# Patient Record
Sex: Female | Born: 1980 | Race: Black or African American | Hispanic: No | Marital: Single | Smoking: Current every day smoker
Health system: Southern US, Community
[De-identification: ages and names within clinical notes are randomized; demographics above are authoritative.]

---
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
## 2002-01-04 ENCOUNTER — Encounter: Admission: RE | Admit: 2002-01-04 | Discharge: 2002-01-04 | Payer: Self-pay | Admitting: *Deleted

## 2002-01-18 ENCOUNTER — Encounter: Admission: RE | Admit: 2002-01-18 | Discharge: 2002-01-18 | Payer: Self-pay | Admitting: *Deleted

## 2002-01-24 ENCOUNTER — Inpatient Hospital Stay (HOSPITAL_COMMUNITY): Admission: RE | Admit: 2002-01-24 | Discharge: 2002-01-30 | Payer: Self-pay | Admitting: *Deleted

## 2002-01-30 ENCOUNTER — Encounter: Payer: Self-pay | Admitting: *Deleted

## 2002-02-07 ENCOUNTER — Observation Stay (HOSPITAL_COMMUNITY): Admission: AD | Admit: 2002-02-07 | Discharge: 2002-02-08 | Payer: Self-pay | Admitting: *Deleted

## 2002-02-07 ENCOUNTER — Encounter: Admission: RE | Admit: 2002-02-07 | Discharge: 2002-02-07 | Payer: Self-pay | Admitting: *Deleted

## 2002-02-14 ENCOUNTER — Inpatient Hospital Stay (HOSPITAL_COMMUNITY): Admission: RE | Admit: 2002-02-14 | Discharge: 2002-02-14 | Payer: Self-pay | Admitting: *Deleted

## 2002-02-14 ENCOUNTER — Encounter: Admission: RE | Admit: 2002-02-14 | Discharge: 2002-02-14 | Payer: Self-pay | Admitting: *Deleted

## 2002-02-21 ENCOUNTER — Encounter: Admission: RE | Admit: 2002-02-21 | Discharge: 2002-02-21 | Payer: Self-pay | Admitting: *Deleted

## 2002-02-21 ENCOUNTER — Inpatient Hospital Stay (HOSPITAL_COMMUNITY): Admission: AD | Admit: 2002-02-21 | Discharge: 2002-02-21 | Payer: Self-pay | Admitting: *Deleted

## 2002-02-27 ENCOUNTER — Encounter (HOSPITAL_COMMUNITY): Admission: RE | Admit: 2002-02-27 | Discharge: 2002-03-29 | Payer: Self-pay | Admitting: *Deleted

## 2002-02-28 ENCOUNTER — Inpatient Hospital Stay (HOSPITAL_COMMUNITY): Admission: AD | Admit: 2002-02-28 | Discharge: 2002-03-02 | Payer: Self-pay | Admitting: Obstetrics and Gynecology

## 2002-04-03 ENCOUNTER — Encounter: Payer: Self-pay | Admitting: *Deleted

## 2002-04-03 ENCOUNTER — Encounter (HOSPITAL_COMMUNITY): Admission: RE | Admit: 2002-04-03 | Discharge: 2002-04-26 | Payer: Self-pay | Admitting: *Deleted

## 2002-04-24 ENCOUNTER — Encounter: Payer: Self-pay | Admitting: *Deleted

## 2002-04-27 ENCOUNTER — Inpatient Hospital Stay (HOSPITAL_COMMUNITY): Admission: AD | Admit: 2002-04-27 | Discharge: 2002-04-29 | Payer: Self-pay | Admitting: *Deleted

## 2002-04-30 ENCOUNTER — Encounter: Admission: RE | Admit: 2002-04-30 | Discharge: 2002-05-30 | Payer: Self-pay | Admitting: *Deleted

## 2002-12-17 ENCOUNTER — Inpatient Hospital Stay (HOSPITAL_COMMUNITY): Admission: AD | Admit: 2002-12-17 | Discharge: 2002-12-17 | Payer: Self-pay | Admitting: Obstetrics and Gynecology

## 2003-10-28 ENCOUNTER — Ambulatory Visit (HOSPITAL_COMMUNITY): Admission: RE | Admit: 2003-10-28 | Discharge: 2003-10-28 | Payer: Self-pay | Admitting: *Deleted

## 2003-12-09 ENCOUNTER — Ambulatory Visit (HOSPITAL_COMMUNITY): Admission: RE | Admit: 2003-12-09 | Discharge: 2003-12-09 | Payer: Self-pay | Admitting: *Deleted

## 2004-01-17 ENCOUNTER — Encounter: Admission: RE | Admit: 2004-01-17 | Discharge: 2004-01-17 | Payer: Self-pay | Admitting: Family Medicine

## 2004-02-07 ENCOUNTER — Encounter: Admission: RE | Admit: 2004-02-07 | Discharge: 2004-02-07 | Payer: Self-pay | Admitting: Family Medicine

## 2004-05-08 ENCOUNTER — Inpatient Hospital Stay (HOSPITAL_COMMUNITY): Admission: AD | Admit: 2004-05-08 | Discharge: 2004-05-10 | Payer: Self-pay | Admitting: *Deleted

## 2004-05-08 ENCOUNTER — Ambulatory Visit: Payer: Self-pay | Admitting: Family Medicine

## 2004-08-28 ENCOUNTER — Ambulatory Visit: Payer: Self-pay | Admitting: Family Medicine

## 2004-12-10 ENCOUNTER — Other Ambulatory Visit: Admission: RE | Admit: 2004-12-10 | Discharge: 2004-12-10 | Payer: Self-pay | Admitting: Family Medicine

## 2004-12-10 ENCOUNTER — Ambulatory Visit: Payer: Self-pay | Admitting: Family Medicine

## 2011-04-11 ENCOUNTER — Ambulatory Visit (INDEPENDENT_AMBULATORY_CARE_PROVIDER_SITE_OTHER): Payer: Self-pay

## 2011-04-11 ENCOUNTER — Inpatient Hospital Stay (INDEPENDENT_AMBULATORY_CARE_PROVIDER_SITE_OTHER)
Admission: RE | Admit: 2011-04-11 | Discharge: 2011-04-11 | Disposition: A | Discharge status: Home / Self Care | Payer: Self-pay | Source: Ambulatory Visit | Attending: Family Medicine | Admitting: Family Medicine

## 2011-04-11 DIAGNOSIS — S93409A Sprain of unspecified ligament of unspecified ankle, initial encounter: Secondary | ICD-10-CM

## 2014-10-31 ENCOUNTER — Emergency Department (HOSPITAL_COMMUNITY)
Admission: EM | Admit: 2014-10-31 | Discharge: 2014-11-01 | Disposition: A | Discharge status: Home / Self Care | Payer: Self-pay | Attending: Emergency Medicine | Admitting: Emergency Medicine

## 2014-10-31 ENCOUNTER — Encounter (HOSPITAL_COMMUNITY): Payer: Self-pay | Admitting: Physical Medicine and Rehabilitation

## 2014-10-31 ENCOUNTER — Emergency Department (HOSPITAL_COMMUNITY): Payer: Self-pay

## 2014-10-31 DIAGNOSIS — N7093 Salpingitis and oophoritis, unspecified: Secondary | ICD-10-CM | POA: Insufficient documentation

## 2014-10-31 DIAGNOSIS — R000 Tachycardia, unspecified: Secondary | ICD-10-CM | POA: Insufficient documentation

## 2014-10-31 DIAGNOSIS — E669 Obesity, unspecified: Secondary | ICD-10-CM | POA: Insufficient documentation

## 2014-10-31 DIAGNOSIS — Z3202 Encounter for pregnancy test, result negative: Secondary | ICD-10-CM | POA: Insufficient documentation

## 2014-10-31 DIAGNOSIS — Z720 Tobacco use: Secondary | ICD-10-CM | POA: Insufficient documentation

## 2014-10-31 DIAGNOSIS — Z79899 Other long term (current) drug therapy: Secondary | ICD-10-CM | POA: Insufficient documentation

## 2014-10-31 DIAGNOSIS — A5900 Urogenital trichomoniasis, unspecified: Secondary | ICD-10-CM | POA: Insufficient documentation

## 2014-10-31 DIAGNOSIS — A599 Trichomoniasis, unspecified: Secondary | ICD-10-CM

## 2014-10-31 LAB — URINALYSIS, ROUTINE W REFLEX MICROSCOPIC
GLUCOSE, UA: NEGATIVE mg/dL
HGB URINE DIPSTICK: NEGATIVE
KETONES UR: 40 mg/dL — AB
Nitrite: NEGATIVE
Protein, ur: NEGATIVE mg/dL
Specific Gravity, Urine: 1.028 (ref 1.005–1.030)
Urobilinogen, UA: 0.2 mg/dL (ref 0.0–1.0)
pH: 6 (ref 5.0–8.0)

## 2014-10-31 LAB — COMPREHENSIVE METABOLIC PANEL
ALT: 13 U/L (ref 0–35)
ANION GAP: 12 (ref 5–15)
AST: 17 U/L (ref 0–37)
Albumin: 3.1 g/dL — ABNORMAL LOW (ref 3.5–5.2)
Alkaline Phosphatase: 89 U/L (ref 39–117)
BILIRUBIN TOTAL: 0.6 mg/dL (ref 0.3–1.2)
BUN: 6 mg/dL (ref 6–23)
CO2: 25 mmol/L (ref 19–32)
CREATININE: 0.79 mg/dL (ref 0.50–1.10)
Calcium: 9.2 mg/dL (ref 8.4–10.5)
Chloride: 99 mmol/L (ref 96–112)
GFR calc non Af Amer: >90 mL/min (ref >90)
Glucose, Bld: 95 mg/dL (ref 70–99)
Potassium: 3.7 mmol/L (ref 3.5–5.1)
Sodium: 136 mmol/L (ref 135–145)
Total Protein: 7.8 g/dL (ref 6.0–8.3)

## 2014-10-31 LAB — CBC WITH DIFFERENTIAL/PLATELET
Basophils Absolute: 0 10*3/uL (ref 0.0–0.1)
Basophils Relative: 0 % (ref 0–1)
EOS PCT: 1 % (ref 0–5)
Eosinophils Absolute: 0.2 10*3/uL (ref 0.0–0.7)
HEMATOCRIT: 31.5 % — AB (ref 36.0–46.0)
Hemoglobin: 9.9 g/dL — ABNORMAL LOW (ref 12.0–15.0)
LYMPHS ABS: 2.8 10*3/uL (ref 0.7–4.0)
Lymphocytes Relative: 16 % (ref 12–46)
MCH: 23.7 pg — AB (ref 26.0–34.0)
MCHC: 31.4 g/dL (ref 30.0–36.0)
MCV: 75.5 fL — AB (ref 78.0–100.0)
Monocytes Absolute: 1.3 10*3/uL — ABNORMAL HIGH (ref 0.1–1.0)
Monocytes Relative: 8 % (ref 3–12)
NEUTROS PCT: 75 % (ref 43–77)
Neutro Abs: 13.1 10*3/uL — ABNORMAL HIGH (ref 1.7–7.7)
Platelets: 778 10*3/uL — ABNORMAL HIGH (ref 150–400)
RBC: 4.17 MIL/uL (ref 3.87–5.11)
RDW: 17.3 % — ABNORMAL HIGH (ref 11.5–15.5)
WBC: 17.4 10*3/uL — ABNORMAL HIGH (ref 4.0–10.5)

## 2014-10-31 LAB — LIPASE, BLOOD: Lipase: 23 U/L (ref 11–59)

## 2014-10-31 LAB — WET PREP, GENITAL
Clue Cells Wet Prep HPF POC: NONE SEEN
Yeast Wet Prep HPF POC: NONE SEEN

## 2014-10-31 LAB — POC URINE PREG, ED: Preg Test, Ur: NEGATIVE

## 2014-10-31 LAB — URINE MICROSCOPIC-ADD ON

## 2014-10-31 MED ORDER — MORPHINE SULFATE 4 MG/ML IJ SOLN
4.0000 mg | Freq: Once | INTRAMUSCULAR | Status: AC
  Administered 2014-10-31: 4 mg via INTRAVENOUS
  Filled 2014-10-31: qty 1

## 2014-10-31 MED ORDER — SODIUM CHLORIDE 0.9 % IV BOLUS (SEPSIS)
1000.0000 mL | Freq: Once | INTRAVENOUS | Status: AC
  Administered 2014-10-31: 1000 mL via INTRAVENOUS

## 2014-10-31 MED ORDER — METRONIDAZOLE 500 MG PO TABS: 500.0000 mg | ORAL_TABLET | Freq: Two times a day (BID) | ORAL | Status: AC

## 2014-10-31 MED ORDER — DEXTROSE 5 % IV SOLN
2.0000 g | Freq: Once | INTRAVENOUS | Status: AC
  Administered 2014-11-01: 2 g via INTRAVENOUS
  Filled 2014-10-31: qty 2

## 2014-10-31 MED ORDER — IOHEXOL 300 MG/ML  SOLN
25.0000 mL | Freq: Once | INTRAMUSCULAR | Status: AC | PRN
  Administered 2014-10-31: 25 mL via ORAL

## 2014-10-31 MED ORDER — IOHEXOL 300 MG/ML  SOLN
100.0000 mL | Freq: Once | INTRAMUSCULAR | Status: AC | PRN
  Administered 2014-10-31: 100 mL via INTRAVENOUS

## 2014-10-31 MED ORDER — AMOXICILLIN-POT CLAVULANATE 875-125 MG PO TABS: 1.0000 | ORAL_TABLET | Freq: Two times a day (BID) | ORAL | Status: AC

## 2014-10-31 MED ORDER — CEFTRIAXONE SODIUM 250 MG IJ SOLR: 250.0000 mg | Freq: Once | INTRAMUSCULAR | Status: DC

## 2014-10-31 MED ORDER — AZITHROMYCIN 250 MG PO TABS: 1000.0000 mg | ORAL_TABLET | Freq: Once | ORAL | Status: DC

## 2014-10-31 MED ORDER — DOXYCYCLINE HYCLATE 100 MG PO TABS
100.0000 mg | ORAL_TABLET | Freq: Once | ORAL | Status: AC
  Administered 2014-11-01: 100 mg via ORAL
  Filled 2014-10-31: qty 1

## 2014-10-31 MED ORDER — METRONIDAZOLE 500 MG PO TABS
2000.0000 mg | ORAL_TABLET | Freq: Once | ORAL | Status: AC
  Administered 2014-11-01: 2000 mg via ORAL
  Filled 2014-10-31 (×2): qty 4

## 2014-10-31 NOTE — ED Provider Notes (Signed)
CSN: [id number]     Arrival date & time 10/31/14  1446 History   First MD Initiated Contact with Patient 10/31/14 1733     Chief Complaint  Patient presents with  . Abdominal Pain     (Consider location/radiation/quality/duration/timing/severity/associated sxs/prior Treatment) HPI\  34 year old female without any significant past medical history who presents for evaluation of abdominal pain. Patient reports for the past 2 weeks she has had intermittent lower abdominal pain. She describes pain as a sharp and throbbing sensation, nonradiating, worsening with standing up straight and improves when she bent over. The pain begins when she started her menstrual period 2 weeks ago. It has since resolved but the pain still persists. Patient thought she may be constipated and she has been using over-the-counter stool softener and states that her bowel movement has been normal just the pain still persists. She recall having STDs in the past and pain felt similar. She denies any pain with sexual activities. No complaints of fever, chills, chest pain, shortness of breath, back pain, dysuria, hematuria, vaginal discharge or bleeding, or rash. No prior history of abdominal surgery. She is a G2P3.  Pain is currently rated 6 out of 10.  History reviewed. No pertinent past medical history. History reviewed. No pertinent past surgical history. No family history on file. History  Substance Use Topics  . Smoking status: Current Every Day Smoker    Types: Cigarettes  . Smokeless tobacco: Not on file  . Alcohol Use: Yes   OB History    No data available     Review of Systems  All other systems reviewed and are negative.     Allergies  Review of patient's allergies indicates no known allergies.  Home Medications   Prior to Admission medications   Medication Sig Start Date End Date Taking? Authorizing Provider  bisacodyl (DULCOLAX) 5 MG EC tablet Take 5 mg by mouth daily as needed for moderate  constipation.   Yes Historical Provider, MD  magnesium citrate SOLN Take 0.5 Bottles by mouth once.   Yes Historical Provider, MD   BP 142/75 mmHg  Pulse 100  Temp(Src) 98.8 F (37.1 C) (Oral)  Resp 18  SpO2 99% Physical Exam  Constitutional: She appears well-developed and well-nourished. No distress.  Obese african [place] female appears to be in no acute distress, nontoxic in appearance.  HENT:  Head: Atraumatic.  Eyes: Conjunctivae are normal.  Neck: Neck supple.  Cardiovascular:  Mild tachycardia without murmur, rubs or gallops  Pulmonary/Chest: Effort normal and breath sounds normal.  Abdominal: Soft. Bowel sounds are normal. She exhibits no distension. There is tenderness (Suprapubic tenderness and right lower quadrant tenderness on palpation without guarding, rebound tenderness).  Genitourinary:  Chaperone present during exam. No inguinal lymphadenopathy, or hernia noted. Normal external genitalia. Vaginal vault with mild white functional discharge with some yellow discharge noted. Closed cervical os visualized.  On bimanual examination, R adnexal tenderness without cervical motion tenderness appreciated.    Neurological: She is alert.  Skin: No rash noted.  Psychiatric: She has a normal mood and affect.  Nursing note and vitals reviewed.   ED Course  Procedures (including critical care time)  Patient with prior history of STD, presenting with pelvic discomfort and vaginal discharge. She does have some right adnexal tenderness on exam along with right lower quadrant abdominal pain. She has a nonsurgical abdomen. She does not appear toxic. She does have a leukocytosis with WBC 17.4. The wet prep reveals a few Trichomonas, but many WBC concerning  for STD. Abdominal pelvis demonstrated a normal appendix, but evidence of right tubo-ovarian abscess with associated infectious process.  Will consult OBGYN and will have pt transfer to [facility] for further care.  Pt voice  understanding and agrees with plan.  Care discussed with Dr. Rubin Payor.  10:58 PM Appreciate consultation from OBGYN Dr. Erin Fulling, who recommend giving pt cefotetan 2g IV and doxycycline  PO in ER.  Pt can be discharge with Augmentin  PO BID x 14 days and Flagyl  PO BID x 14 days.  She will also need to f/u in office on Monday .  Pt voice understanding and agrees with plan.  Pt is stable for discharge.  Non toxic in appearance and no peritonitis on exam.    Labs Review Labs Reviewed  WET PREP, GENITAL - Abnormal; Notable for the following:    Trich, Wet Prep FEW (*)    WBC, Wet Prep HPF POC MANY (*)    All other components within normal limits  CBC WITH DIFFERENTIAL/PLATELET - Abnormal; Notable for the following:    WBC 17.4 (*)    Hemoglobin 9.9 (*)    HCT 31.5 (*)    MCV 75.5 (*)    MCH 23.7 (*)    RDW 17.3 (*)    Platelets 778 (*)    Neutro Abs 13.1 (*)    Monocytes Absolute 1.3 (*)    All other components within normal limits  COMPREHENSIVE METABOLIC PANEL - Abnormal; Notable for the following:    Albumin 3.1 (*)    All other components within normal limits  URINALYSIS, ROUTINE W REFLEX MICROSCOPIC - Abnormal; Notable for the following:    Color, Urine AMBER (*)    APPearance CLOUDY (*)    Bilirubin Urine SMALL (*)    Ketones, ur 40 (*)    Leukocytes, UA SMALL (*)    All other components within normal limits  URINE MICROSCOPIC-ADD ON - Abnormal; Notable for the following:    Squamous Epithelial / LPF FEW (*)    All other components within normal limits  LIPASE, BLOOD  RPR  HIV ANTIBODY (ROUTINE TESTING)  POC URINE PREG, ED  GC/CHLAMYDIA PROBE AMP ([place])    Imaging Review Ct Abdomen Pelvis W Contrast  10/31/2014   CLINICAL DATA:  Abdominal swelling and pain for 3 weeks. Leukocytosis.  EXAM: CT ABDOMEN AND PELVIS WITH CONTRAST  TECHNIQUE: Multidetector CT imaging of the abdomen and pelvis was performed using the standard protocol following  bolus administration of intravenous contrast.  CONTRAST:  OMNIPAQUE IOHEXOL 300 MG/ML  SOLN  COMPARISON:  None.  FINDINGS: Lung bases appear grossly clear, allowing for motion artifact. Tiny pneumatocele in the left lung base.  The liver, spleen, gallbladder, pancreas, adrenal glands, abdominal aorta, inferior vena cava are unremarkable. Retroperitoneal lymph nodes are moderately prominent without pathologic enlargement, likely reactive. Increased density in the renal collecting systems bilaterally probably represents contrast material from a test dose. No definite renal stones identified. Renal nephrograms are symmetrical and there is no hydronephrosis. Stomach, small bowel, and colon are not abnormally distended. No free air or free fluid in the abdomen. Abdominal wall musculature appears intact.  Pelvis: Inflammatory process demonstrated in the right lower quadrant and right pelvis with stranding and edema in the pelvic and right lower quadrant fat, free fluid collections, and infiltration demonstrated in the right lower quadrant fat. Loculated fluid collections in the right pelvis measuring up to 2.9 x 4.4 cm. This could represent ovarian cysts or  small abscess. The appendix is identified and appears normal without evidence of appendiceal distention or wall thickening. Appearance therefore is most likely to represent tubo-ovarian abscess. Uterus and left ovary are not enlarged. Bladder wall is not thickened. No destructive bone lesions.  IMPRESSION: Inflammatory stranding, edema, and fluid demonstrated in the pelvis and right lower quadrant with loculated cystic changes demonstrated in the right adnexal region. A normal appendix is present. Changes most likely to represent tubo-ovarian abscess and associated infectious process.   Electronically Signed   By: Burman Nieves M.D.   On: 10/31/2014 22:19     EKG Interpretation None      MDM   Final diagnoses:  TOA (tubo-ovarian abscess)   Trichomoniasis    BP 110/71 mmHg  Pulse 90  Temp(Src) 98.8 F (37.1 C) (Oral)  Resp 18  SpO2 100%  LMP   I have reviewed nursing notes and vital signs. I personally reviewed the imaging tests through PACS system  I reviewed available ER/hospitalization records thought the EMR     Fayrene Helper, PA-C 10/31/14 2331  Benjiman Core, MD 11/04/14 1500

## 2014-10-31 NOTE — Discharge Instructions (Signed)
You have been evaluated for your pelvic pain.  You have evidence of an abscess in your right fallopian tube (tuboovarian abscess).  Please take antibiotics as prescribed for the full duration.  Follow [facility]way-[facility] Monday at 10am for further care.  Return to ER if your condition worsen or if you have other concerns.   Pelvic Inflammatory Disease Pelvic inflammatory disease (PID) refers to an infection in some or all of the female organs. The infection can be in the uterus, ovaries, fallopian tubes, or the surrounding tissues in the pelvis. PID can cause abdominal or pelvic pain that comes on suddenly (acute pelvic pain). PID is a serious infection because it can lead to lasting (chronic) pelvic pain or the inability to have children (infertile).  CAUSES  The infection is often caused by the normal bacteria found in the vaginal tissues. PID may also be caused by an infection that is spread during sexual contact. PID can also occur following:   The birth of a baby.   A miscarriage.   An abortion.   Major pelvic surgery.   The use of an intrauterine device (IUD).   A sexual assault.  RISK FACTORS Certain factors can put a person at higher risk for PID, such as:  Being younger than 25 years.  Being sexually active at [place] age.  Usingnonbarrier contraception.  Havingmultiple sexual partners.  Having sex with someone who has symptoms of a genital infection.  Using oral contraception. Other times, certain behaviors can increase the possibility of getting PID, such as:  Having sex during your period.  Using a vaginal douche.  Having an intrauterine device (IUD) in place. SYMPTOMS   Abdominal or pelvic pain.   Fever.   Chills.   Abnormal vaginal discharge.  Abnormal uterine bleeding.   Unusual pain shortly after finishing your period. DIAGNOSIS  Your caregiver will choose some of the following methods to make a  diagnosis, such as:   Performinga physical exam and history. A pelvic exam typically reveals a very tender uterus and surrounding pelvis.   Ordering laboratory tests including a pregnancy test, blood tests, and urine test.  Orderingcultures of the vagina and cervix to check for a sexually transmitted infection (STI).  Performing an ultrasound.   Performing a laparoscopic procedure to look inside the pelvis.  TREATMENT   Antibiotic medicines may be prescribed and taken by mouth.   Sexual partners may be treated when the infection is caused by a sexually transmitted disease (STD).   Hospitalization may be needed to give antibiotics intravenously.  Surgery may be needed, but this is rare. It may take weeks until you are completely well. If you are diagnosed with PID, you should also be checked for human immunodeficiency virus (HIV). HOME CARE INSTRUCTIONS   If given, take your antibiotics as directed. Finish the medicine even if you start to feel better.   Only take over-the-counter or prescription medicines for pain, discomfort, or fever as directed by your caregiver.   Do not have sexual intercourse until treatment is completed or as directed by your caregiver. If PID is confirmed, your recent sexual partner(s) will need treatment.   Keep your follow-up appointments. SEEK MEDICAL CARE IF:   You have increased or abnormal vaginal discharge.   You need prescription medicine for your pain.   You vomit.   You cannot take your medicines.   Your partner has an STD.  SEEK IMMEDIATE MEDICAL CARE IF:   You  have a fever.   You have increased abdominal or pelvic pain.   You have chills.   You have pain when you urinate.   You are not better after 72 hours following treatment.  MAKE SURE YOU:   Understand these instructions.  Will watch your condition.  Will get help right away if you are not doing well or get worse. Document Released: 07/12/2005  Document Revised: 11/06/2012 Document Reviewed: 07/08/2011 [facility] Patient Information 2015 [place], [place]. This information is not intended to replace advice given to you by your health care provider. Make sure you discuss any questions you have with your health care provider.

## 2014-10-31 NOTE — ED Notes (Signed)
Pt presents to department for evaluation of lower abdominal pain. Ongoing x3 weeks. Denies vaginal discharge/bleeding. Denies urinary symptoms. Pt is alert and oriented x4.

## 2014-11-01 LAB — RPR: RPR Ser Ql: NONREACTIVE

## 2014-11-01 LAB — GC/CHLAMYDIA PROBE AMP (~~LOC~~) NOT AT ARMC
Chlamydia: NEGATIVE
Neisseria Gonorrhea: POSITIVE — AB

## 2014-11-01 LAB — HIV ANTIBODY (ROUTINE TESTING W REFLEX): HIV Screen 4th Generation wRfx: NONREACTIVE

## 2014-11-01 MED ORDER — ONDANSETRON HCL 4 MG/2ML IJ SOLN
4.0000 mg | Freq: Once | INTRAMUSCULAR | Status: AC
  Administered 2014-11-01: 4 mg via INTRAVENOUS
  Filled 2014-11-01: qty 2

## 2014-11-01 MED ORDER — DOXYCYCLINE HYCLATE 100 MG PO TABS
100.0000 mg | ORAL_TABLET | Freq: Once | ORAL | Status: DC
  Filled 2014-11-01: qty 1

## 2014-11-01 NOTE — ED Notes (Signed)
Pt refusing doxycycline; sts "I just puked up all those pills I'm not taking anything else. I hadn't ate anything all day." Pt offered crackers, pt refused

## 2014-11-04 ENCOUNTER — Encounter: Payer: Self-pay | Admitting: *Deleted

## 2014-11-04 ENCOUNTER — Encounter: Payer: Medicaid Other | Admitting: Medical

## 2014-11-04 ENCOUNTER — Telehealth: Payer: Self-pay | Admitting: *Deleted

## 2014-11-04 NOTE — Telephone Encounter (Signed)
Attempt to contact patient concerning missed appointment, no answer, left a message for patient to contact the clinic to reschedule this appointment.  Letter sent.

## 2014-11-06 ENCOUNTER — Telehealth (HOSPITAL_BASED_OUTPATIENT_CLINIC_OR_DEPARTMENT_OTHER): Payer: Self-pay | Admitting: Emergency Medicine

## 2014-11-07 ENCOUNTER — Telehealth: Payer: Self-pay | Admitting: *Deleted

## 2014-11-08 ENCOUNTER — Telehealth (HOSPITAL_BASED_OUTPATIENT_CLINIC_OR_DEPARTMENT_OTHER): Payer: Self-pay | Admitting: *Deleted

## 2014-12-04 ENCOUNTER — Telehealth (HOSPITAL_BASED_OUTPATIENT_CLINIC_OR_DEPARTMENT_OTHER): Payer: Self-pay | Admitting: Emergency Medicine

## 2014-12-04 NOTE — Telephone Encounter (Signed)
Lost to followup 

## 2016-04-27 IMAGING — CT CT ABD-PELV W/ CM
2 of 4 series · 16 of 46 positions shown, 18 images · IV contrast (Omni 300)
Comparison: None.

CLINICAL DATA: Abdominal swelling and pain for 3 weeks.
Leukocytosis.

EXAM:
CT ABDOMEN AND PELVIS WITH CONTRAST
TECHNIQUE: Multidetector CT imaging of the abdomen and pelvis was performed
using the standard protocol following bolus administration of
intravenous contrast.
CONTRAST:  100mL OMNIPAQUE IOHEXOL 300 MG/ML  SOLN

[Series 2: abd/ pelvis 5.0 i30f 1 · axial · 0.70mm/px · z∈[-761,-316]mm · 13 of 99 slices shown, 15 images]
[im 5/99  soft-tissue]
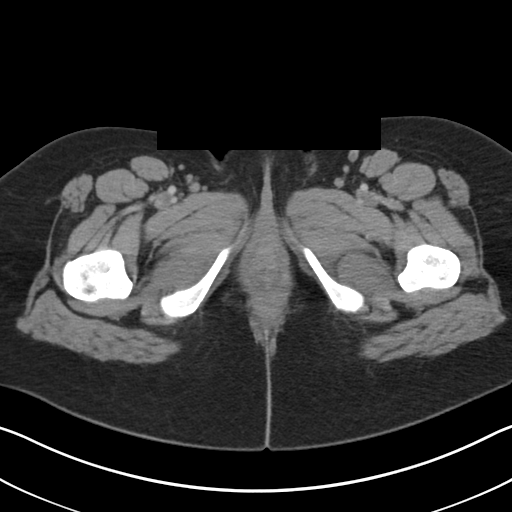
[im 5/99  bone]
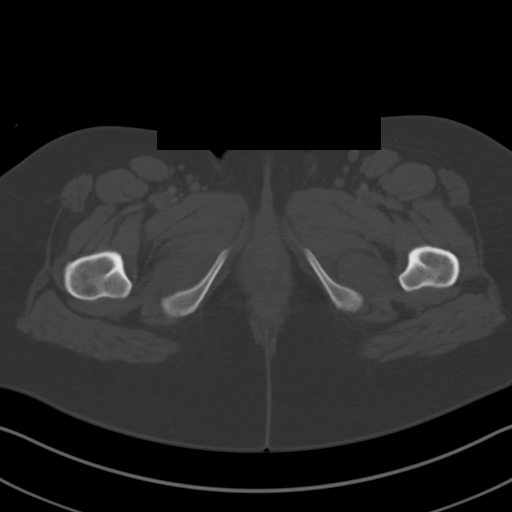
[im 13/99  soft-tissue]
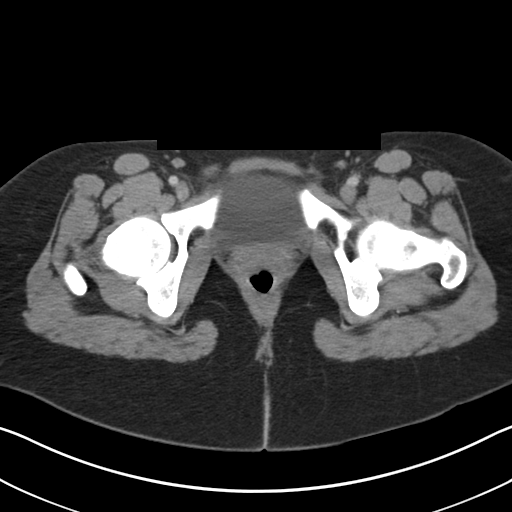
[im 22/99  soft-tissue]
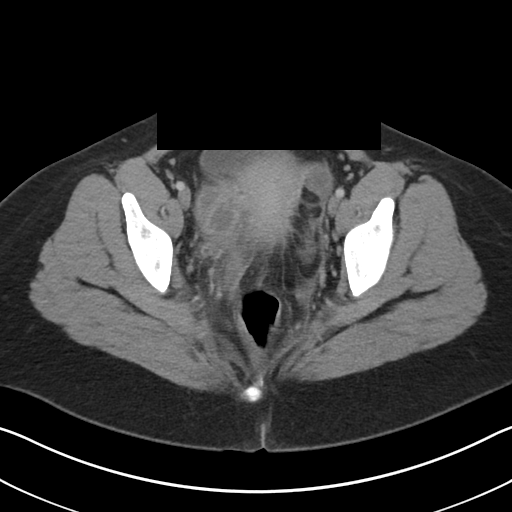
[im 26/99  soft-tissue]
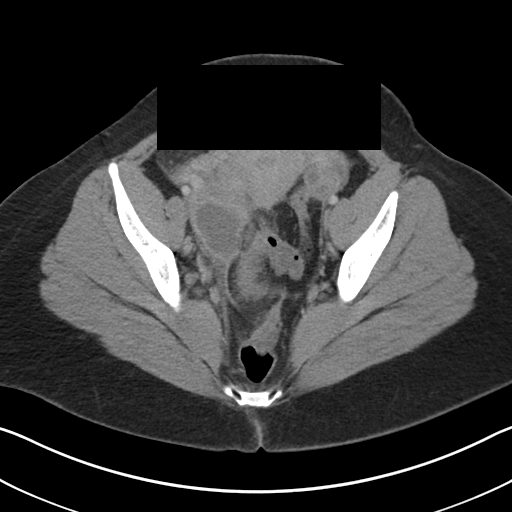
[im 35/99  soft-tissue]
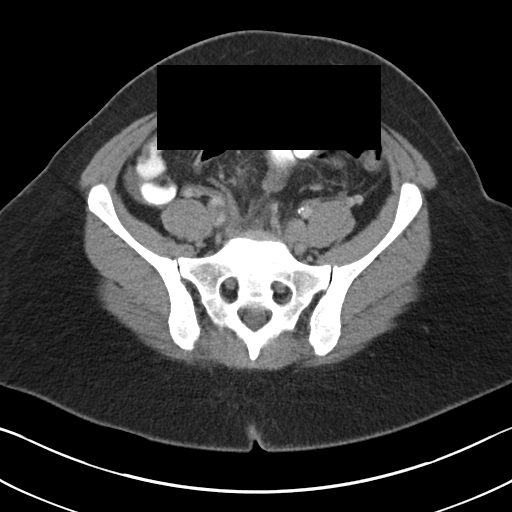
[im 43/99  soft-tissue]
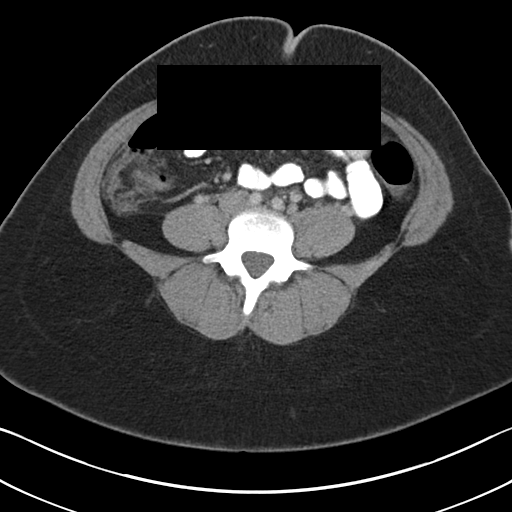
[im 52/99  soft-tissue]
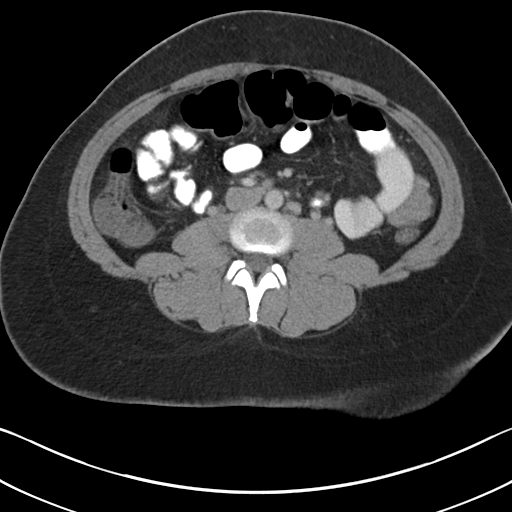
[im 56/99  soft-tissue]
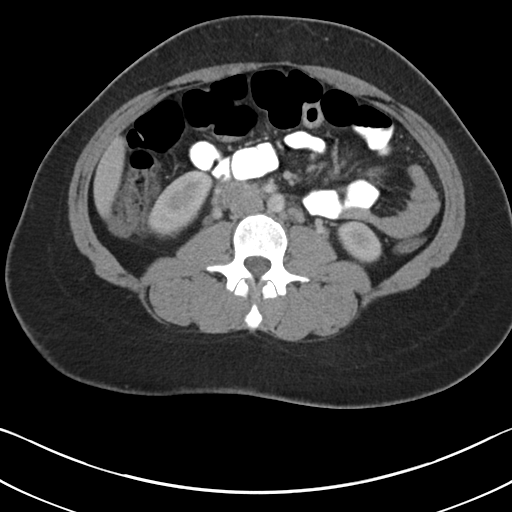
[im 64/99  soft-tissue]
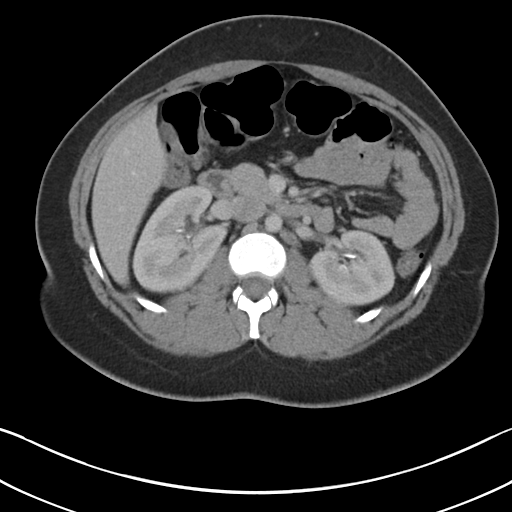
[im 64/99  bone]
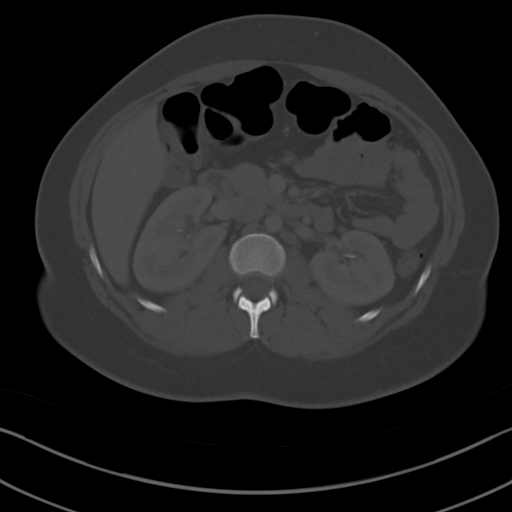
[im 73/99  soft-tissue]
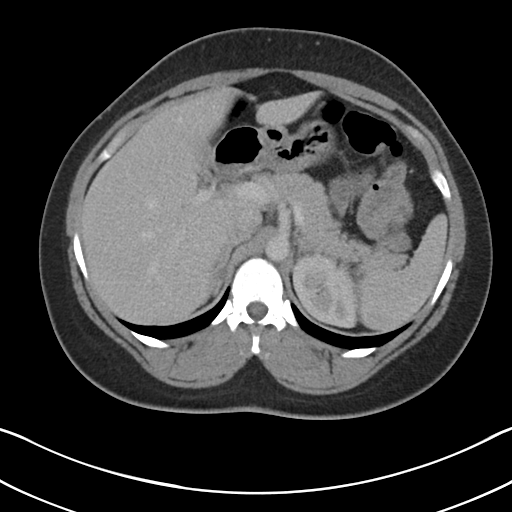
[im 77/99  soft-tissue]
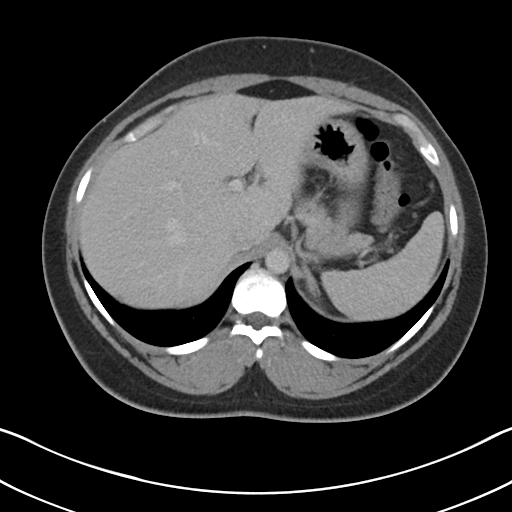
[im 86/99  soft-tissue]
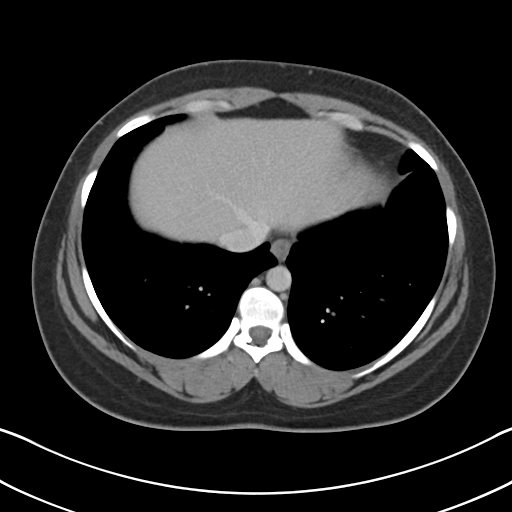
[im 94/99  soft-tissue]
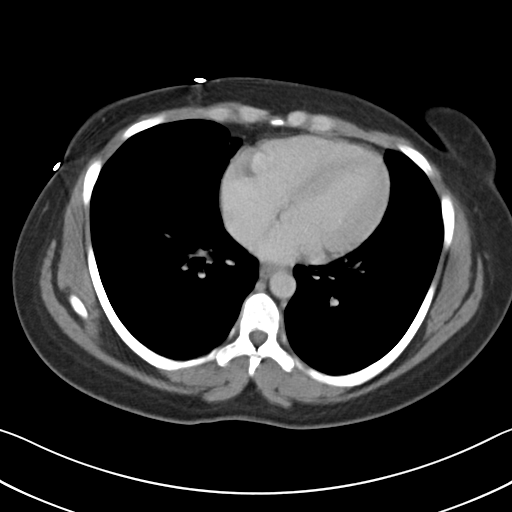

[Series 5: coronals · coronal · 0.71mm/px · 3 of 130 slices shown]
[im 44/130  soft-tissue]
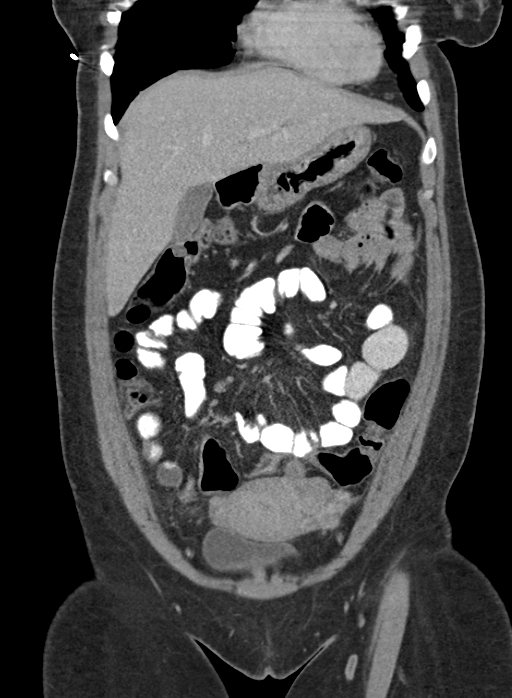
[im 58/130  soft-tissue]
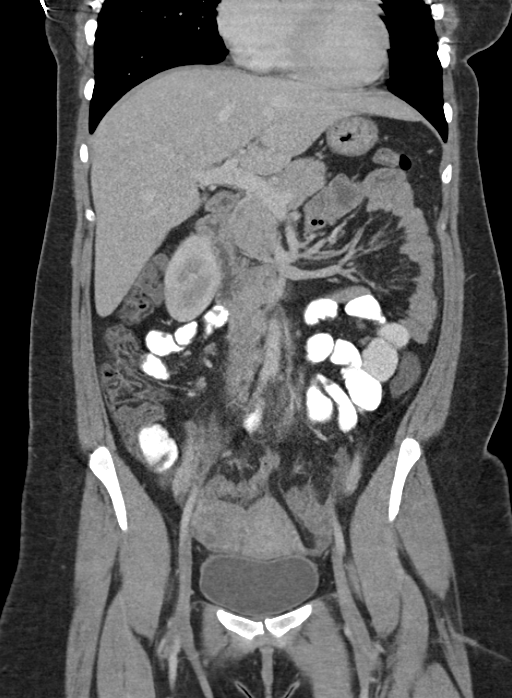
[im 72/130  soft-tissue]
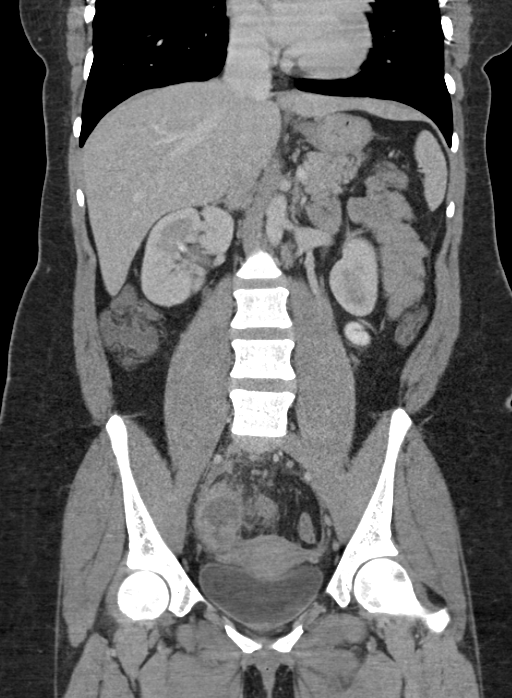

[16 of 46 positions shown; findings below may reference images not displayed]

FINDINGS: Lung bases appear grossly clear, allowing for motion artifact. Tiny
pneumatocele in the left lung base.

The liver, spleen, gallbladder, pancreas, adrenal glands, abdominal
aorta, inferior vena cava are unremarkable. Retroperitoneal lymph
nodes are moderately prominent without pathologic enlargement,
likely reactive. Increased density in the renal collecting systems
bilaterally probably represents contrast material from a test dose.
No definite renal stones identified. Renal nephrograms are
symmetrical and there is no hydronephrosis. Stomach, small bowel,
and colon are not abnormally distended. No free air or free fluid in
the abdomen. Abdominal wall musculature appears intact.

Pelvis: Inflammatory process demonstrated in the right lower
quadrant and right pelvis with stranding and edema in the pelvic and
right lower quadrant fat, free fluid collections, and infiltration
demonstrated in the right lower quadrant fat. Loculated fluid
collections in the right pelvis measuring up to 2.9 x 4.4 cm. This
could represent ovarian cysts or small abscess. The appendix is
identified and appears normal without evidence of appendiceal
distention or wall thickening. Appearance therefore is most likely
to represent tubo-ovarian abscess. Uterus and left ovary are not
enlarged. Bladder wall is not thickened. No destructive bone
lesions.
IMPRESSION: Inflammatory stranding, edema, and fluid demonstrated in the pelvis
and right lower quadrant with loculated cystic changes demonstrated
in the right adnexal region. A normal appendix is present. Changes
most likely to represent tubo-ovarian abscess and associated
infectious process.

## 2019-08-03 ENCOUNTER — Encounter (HOSPITAL_COMMUNITY): Payer: Self-pay

## 2019-08-03 ENCOUNTER — Ambulatory Visit (HOSPITAL_COMMUNITY)
Admission: EM | Admit: 2019-08-03 | Discharge: 2019-08-03 | Disposition: A | Discharge status: Home / Self Care | Payer: Self-pay | Attending: Family Medicine | Admitting: Family Medicine

## 2019-08-03 ENCOUNTER — Other Ambulatory Visit: Payer: Self-pay

## 2019-08-03 DIAGNOSIS — R202 Paresthesia of skin: Secondary | ICD-10-CM

## 2019-08-03 MED ORDER — PREDNISONE 50 MG PO TABS: ORAL_TABLET | ORAL | 0 refills | Status: AC
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
## 2019-08-03 NOTE — Discharge Instructions (Addendum)
This appears to be an irritated nerve, usually caused by swollen disc in neck.  You should see improvement over 3 days.  If not, we should do further evaluation.  Covid prevention measures:  Vitamin D3 5000 IU (125 mcg) daily Vitamin C 500 mg twice daily Zinc 50 to 75 mg daily  Listerine type mouthwash 4 times a day
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
## 2020-02-05 ENCOUNTER — Encounter (HOSPITAL_COMMUNITY): Payer: Self-pay

## 2020-02-05 ENCOUNTER — Emergency Department (HOSPITAL_COMMUNITY)
Admission: EM | Admit: 2020-02-05 | Discharge: 2020-02-05 | Disposition: A | Discharge status: Home / Self Care | Payer: Medicaid Other | Attending: Emergency Medicine | Admitting: Emergency Medicine

## 2020-02-05 ENCOUNTER — Other Ambulatory Visit: Payer: Self-pay

## 2020-02-05 DIAGNOSIS — F17290 Nicotine dependence, other tobacco product, uncomplicated: Secondary | ICD-10-CM | POA: Insufficient documentation

## 2020-02-05 DIAGNOSIS — R202 Paresthesia of skin: Secondary | ICD-10-CM | POA: Insufficient documentation

## 2020-02-05 MED ORDER — NAPROXEN 375 MG PO TABS: 375.0000 mg | ORAL_TABLET | Freq: Two times a day (BID) | ORAL | 0 refills | Status: AC

## 2020-02-05 NOTE — ED Triage Notes (Signed)
Patient states she has been having intermittent left arm numbness  X 1 year. Patient states she has been going to a chiropractor for adjustments and now her right arm is going numb when driving or sleeping.

## 2020-02-05 NOTE — ED Provider Notes (Signed)
[facility]-EMERGENCY DEPT Provider Note   CSN: [id number] Arrival date & time: 02/05/20  1226     History Chief Complaint  Patient presents with  . arm numbness    Chelsea Bullock is a 39 y.o. female.  HPI   39 year old female presenting for evaluation of right arm paresthesias.  She states that symptoms have been ongoing intermittently for almost 1 year.  States that symptoms seem to be worse when she is sleeping or driving.  Has pain/paresthesias that radiates along the right upper extremity.  She is right-handed.  States pain is worse in the hand.  Symptoms improve throughout the day.  She denies any associated weakness and is able to complete her daily activities without issue.  She has been seen at urgent care in the past and was given some medications which did not significantly improve her symptoms.  She has later followed up with a chiropractor but this has also not been improving her symptoms.  She was last seen about 1 week ago by the chiropractor.  She denies that her symptoms are significantly worse since seeing the chiropractor.  Denies any headaches, visual changes, lightheadedness, dizziness, leg numbness/weakness, neck pain or back pain.  History reviewed. No pertinent past medical history.  There are no problems to display for this patient.   History reviewed. No pertinent surgical history.   OB History   No obstetric history on file.     Family History  Problem Relation Age of Onset  . Healthy Mother     Social History   Tobacco Use  . Smoking status: Current Every Day Smoker    Types: Cigars  . Smokeless tobacco: Never Used  . Tobacco comment: 2 black and milds per day  Vaping Use  . Vaping Use: Never used  Substance Use Topics  . Alcohol use: Yes    Comment: socially  . Drug use: No    Home Medications Prior to Admission medications   Medication Sig Start Date End Date Taking? Authorizing Provider  bisacodyl (DULCOLAX) 5  MG EC tablet Take 5 mg by mouth daily as needed for moderate constipation.    [provider]  magnesium citrate SOLN Take 0.5 Bottles by mouth once.    [provider]  naproxen (NAPROSYN) 375 MG tablet Take 1 tablet (375 mg total) by mouth 2 (two) times daily for 7 days. 02/05/20 02/12/20  Temia Debroux S, PA-C  predniSONE (DELTASONE) 50 MG tablet One daily with food 08/03/19   Elvina Sidle, MD    Allergies    Citrus and Watermelon [citrullus vulgaris]  Review of Systems   Review of Systems  Eyes: Negative for visual disturbance.  Musculoskeletal: Negative for back pain and neck pain.  Neurological: Positive for numbness (paresthesias). Negative for dizziness, weakness, light-headedness and headaches.    Physical Exam Updated Vital Signs BP 116/76 (BP Location: Left Arm)   Pulse 75   Temp 98.3 F (36.8 C) (Oral)   Resp 16   Ht 5\' 6"  (1.676 m)   Wt 77.1 kg   LMP 02/01/2020   BMI 27.44 kg/m   Physical Exam Vitals and nursing note reviewed.  Constitutional:      General: She is not in acute distress.    Appearance: She is well-developed.  HENT:     Head: Normocephalic and atraumatic.  Eyes:     Conjunctiva/sclera: Conjunctivae normal.  Cardiovascular:     Rate and Rhythm: Normal rate.  Pulmonary:  Effort: Pulmonary effort is normal.  Musculoskeletal:        General: Normal range of motion.     Cervical back: Neck supple.  Skin:    General: Skin is warm and dry.  Neurological:     Mental Status: She is alert.     Comments: Mental Status:  Alert, thought content appropriate, able to give a coherent history. Speech fluent without evidence of aphasia. Able to follow 2 step commands without difficulty.  Cranial Nerves:  II:  pupils equal, round, reactive to light III,IV, VI: ptosis not present, extra-ocular motions intact bilaterally  V,VII: smile symmetric, facial light touch sensation equal VIII: hearing grossly normal to voice  X: uvula  elevates symmetrically  XI: bilateral shoulder shrug symmetric and strong XII: midline tongue extension without fassiculations Motor:  Normal tone. 5/5 strength of BUE and BLE major muscle groups including strong and equal grip strength and dorsiflexion/plantar flexion Sensory: light touch normal in all extremities with exception of decreased sensation to the thumb, index and middle finger on the right hand CV: 2+ radial pulses Slightly positive tinnels sign      ED Results / Procedures / Treatments   Labs (all labs ordered are listed, but only abnormal results are displayed) Labs Reviewed - No data to display  EKG None  Radiology No results found.  Procedures Procedures (including critical care time)  Medications Ordered in ED Medications - No data to display  ED Course  I have reviewed the triage vital signs and the nursing notes.  Pertinent labs & imaging results that were available during my care of the patient were reviewed by me and considered in my medical decision making (see chart for details).    MDM Rules/Calculators/A&P                          39 year old female with intermittent right arm numbness for almost 1 year.  Worse after sleeping and with certain activities.  On exam she has intact strength and range of motion.  She does have decreased sensation to the right thumb index and middle finger of the right hand over median nerve distribution.  She has a slightly positive Tinel's sign.  I question whether her symptoms are related to carpal tunnel.  We will start her on a course of anti-inflammatories and give her a wrist splint.  I will also give her Ortho followed up.  She does not have any significant weakness or other neurologic deficits on exam to suggest any other emergent process at this time.  Have advised her on specific return precautions.  She voices understanding of the plan and reasons to return.  All questions answered.  Patient stable for  discharge.   Final Clinical Impression(s) / ED Diagnoses Final diagnoses:  Paresthesias    Rx / DC Orders ED Discharge Orders         Ordered    naproxen (NAPROSYN) 375 MG tablet  2 times daily     Discontinue  Reprint     02/05/20 1417           Matthew Pais S, PA-C 02/05/20 1418    Derwood Kaplan, MD 02/05/20 1429

## 2020-02-05 NOTE — Discharge Instructions (Signed)
You may alternate taking Tylenol and Naproxen as needed for pain control. You may take Naproxen twice daily as directed on your discharge paperwork and you may take  [phone] mg of Tylenol every 6 hours. Do not exceed 4000 mg of Tylenol daily as this can lead to liver damage. Also, make sure to take Naproxen with meals as it can cause an upset stomach. Do not take other NSAIDs while taking Naproxen such as (Aleve, Ibuprofen, Aspirin, Celebrex, etc) and do not take more than the prescribed dose as this can lead to ulcers and bleeding in your GI tract. You may use warm and cold compresses to help with your symptoms.   Please wear the wrist brace at night.  Please follow up with your primary doctor within the next 7-10 days for re-evaluation and further treatment of your symptoms.   Please return to the ER sooner if you have any new or worsening symptoms.

## 2020-04-18 ENCOUNTER — Other Ambulatory Visit: Payer: Medicaid Other

## 2020-04-18 DIAGNOSIS — Z20822 Contact with and (suspected) exposure to covid-19: Secondary | ICD-10-CM

## 2020-04-20 LAB — NOVEL CORONAVIRUS, NAA: SARS-CoV-2, NAA: DETECTED — AB

## 2020-04-20 LAB — SARS-COV-2, NAA 2 DAY TAT

## 2020-04-21 ENCOUNTER — Other Ambulatory Visit (HOSPITAL_COMMUNITY): Payer: Self-pay | Admitting: Adult Health

## 2020-04-21 ENCOUNTER — Ambulatory Visit (HOSPITAL_COMMUNITY)
Admission: RE | Admit: 2020-04-21 | Discharge: 2020-04-21 | Disposition: A | Discharge status: Home / Self Care | Payer: Medicaid Other | Source: Ambulatory Visit | Attending: Pulmonary Disease | Admitting: Pulmonary Disease

## 2020-04-21 DIAGNOSIS — U071 COVID-19: Secondary | ICD-10-CM

## 2020-04-21 MED ORDER — ALBUTEROL SULFATE HFA 108 (90 BASE) MCG/ACT IN AERS: 2.0000 | INHALATION_SPRAY | Freq: Once | RESPIRATORY_TRACT | Status: DC | PRN

## 2020-04-21 MED ORDER — EPINEPHRINE 0.3 MG/0.3ML IJ SOAJ: 0.3000 mg | Freq: Once | INTRAMUSCULAR | Status: DC | PRN

## 2020-04-21 MED ORDER — FAMOTIDINE IN NACL 20-0.9 MG/50ML-% IV SOLN: 20.0000 mg | Freq: Once | INTRAVENOUS | Status: DC | PRN

## 2020-04-21 MED ORDER — METHYLPREDNISOLONE SODIUM SUCC 125 MG IJ SOLR: 125.0000 mg | Freq: Once | INTRAMUSCULAR | Status: DC | PRN

## 2020-04-21 MED ORDER — SODIUM CHLORIDE 0.9 % IV SOLN: INTRAVENOUS | Status: DC | PRN

## 2020-04-21 MED ORDER — SODIUM CHLORIDE 0.9 % IV SOLN
1200.0000 mg | Freq: Once | INTRAVENOUS | Status: AC
  Administered 2020-04-21: 1200 mg via INTRAVENOUS

## 2020-04-21 MED ORDER — DIPHENHYDRAMINE HCL 50 MG/ML IJ SOLN: 50.0000 mg | Freq: Once | INTRAMUSCULAR | Status: DC | PRN

## 2020-04-21 NOTE — Progress Notes (Signed)
  Diagnosis: COVID-19  Physician: dr patrick wright  Procedure: [facility] Med: casirivimab\imdevimab infusion - Provided patient with casirivimab\imdevimab fact sheet for patients, parents and caregivers prior to infusion.  Complications: No immediate complications noted.  Discharge: Discharged home   Dhiren Azimi R 04/21/2020   

## 2020-04-21 NOTE — Discharge Instructions (Signed)

## 2020-04-21 NOTE — Progress Notes (Signed)
I connected by phone with Chelsea Bullock on 04/21/2020 at 1:26 PM to discuss the potential use of a new treatment for mild to moderate COVID-19 viral infection in non-hospitalized patients.  This patient is a 39 y.o. female that meets the FDA criteria for Emergency Use Authorization of COVID monoclonal antibody casirivimab/imdevimab or bamlanivimab/eteseviamb.  Has a (+) direct SARS-CoV-2 viral test result  Has mild or moderate COVID-19   Is NOT hospitalized due to COVID-19  Is within 10 days of symptom onset  Has at least one of the high risk factor(s) for progression to severe COVID-19 and/or hospitalization as defined in EUA.  Specific high risk criteria : BMI > 25   I have spoken and communicated the following to the patient or parent/caregiver regarding COVID monoclonal antibody treatment:  1. FDA has authorized the emergency use for the treatment of mild to moderate COVID-19 in adults and pediatric patients with positive results of direct SARS-CoV-2 viral testing who are 90 years of age and older weighing at least 40 kg, and who are at high risk for progressing to severe COVID-19 and/or hospitalization.  2. The significant known and potential risks and benefits of COVID monoclonal antibody, and the extent to which such potential risks and benefits are unknown.  3. Information on available alternative treatments and the risks and benefits of those alternatives, including clinical trials.  4. Patients treated with COVID monoclonal antibody should continue to self-isolate and use infection control measures (e.g., wear mask, isolate, social distance, avoid sharing personal items, clean and disinfect "high touch" surfaces, and frequent handwashing) according to CDC guidelines.   5. The patient or parent/caregiver has the option to accept or refuse COVID monoclonal antibody treatment.  After reviewing this information with the patient, the patient has agreed to receive one of the  available covid 19 monoclonal antibodies and will be provided an appropriate fact sheet prior to infusion. Chelsea Filbert, NP 04/21/2020 1:26 PM

## 2020-05-02 ENCOUNTER — Other Ambulatory Visit: Payer: Medicaid Other

## 2020-05-02 DIAGNOSIS — Z20822 Contact with and (suspected) exposure to covid-19: Secondary | ICD-10-CM

## 2020-05-04 LAB — SARS-COV-2, NAA 2 DAY TAT

## 2020-05-04 LAB — NOVEL CORONAVIRUS, NAA: SARS-CoV-2, NAA: NOT DETECTED
